# Patient Record
Sex: Female | Born: 1995 | Race: White | Hispanic: No | Marital: Single | Smoking: Never smoker
Health system: Southern US, Community
[De-identification: ages and names within clinical notes are randomized; demographics above are authoritative.]

## PROBLEM LIST (undated history)

## (undated) DIAGNOSIS — F909 Attention-deficit hyperactivity disorder, unspecified type: Secondary | ICD-10-CM

## (undated) DIAGNOSIS — J45909 Unspecified asthma, uncomplicated: Secondary | ICD-10-CM

## (undated) HISTORY — PX: NO PAST SURGERIES: SHX2092

---
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
## 2017-05-25 ENCOUNTER — Encounter: Payer: Self-pay | Admitting: Emergency Medicine

## 2017-05-25 ENCOUNTER — Emergency Department
Admission: EM | Admit: 2017-05-25 | Discharge: 2017-05-25 | Disposition: A | Discharge status: Home / Self Care | Payer: No Typology Code available for payment source | Attending: Emergency Medicine | Admitting: Emergency Medicine

## 2017-05-25 ENCOUNTER — Emergency Department: Payer: No Typology Code available for payment source

## 2017-05-25 DIAGNOSIS — Z3A00 Weeks of gestation of pregnancy not specified: Secondary | ICD-10-CM | POA: Insufficient documentation

## 2017-05-25 DIAGNOSIS — N939 Abnormal uterine and vaginal bleeding, unspecified: Secondary | ICD-10-CM

## 2017-05-25 DIAGNOSIS — N898 Other specified noninflammatory disorders of vagina: Secondary | ICD-10-CM | POA: Insufficient documentation

## 2017-05-25 DIAGNOSIS — O039 Complete or unspecified spontaneous abortion without complication: Secondary | ICD-10-CM | POA: Insufficient documentation

## 2017-05-25 HISTORY — DX: Attention-deficit hyperactivity disorder, unspecified type: F90.9

## 2017-05-25 LAB — CBC
HCT: 38.1 % (ref 35.0–47.0)
Hemoglobin: 12.5 g/dL (ref 12.0–16.0)
MCH: 28.1 pg (ref 26.0–34.0)
MCHC: 32.9 g/dL (ref 32.0–36.0)
MCV: 85.5 fL (ref 80.0–100.0)
PLATELETS: 373 10*3/uL (ref 150–440)
RBC: 4.46 MIL/uL (ref 3.80–5.20)
RDW: 14.9 % — ABNORMAL HIGH (ref 11.5–14.5)
WBC: 10.3 10*3/uL (ref 3.6–11.0)

## 2017-05-25 LAB — CHLAMYDIA/NGC RT PCR (ARMC ONLY)
CHLAMYDIA TR: NOT DETECTED
N gonorrhoeae: NOT DETECTED

## 2017-05-25 LAB — WET PREP, GENITAL
CLUE CELLS WET PREP: NONE SEEN
SPERM: NONE SEEN
TRICH WET PREP: NONE SEEN
Yeast Wet Prep HPF POC: NONE SEEN

## 2017-05-25 LAB — HCG, QUANTITATIVE, PREGNANCY: hCG, Beta Chain, Quant, S: <1 m[IU]/mL (ref <5)

## 2017-05-25 LAB — POCT PREGNANCY, URINE: Preg Test, Ur: NEGATIVE

## 2017-05-25 LAB — ABO/RH: ABO/RH(D): O POS

## 2017-05-25 MED ORDER — ONDANSETRON 4 MG PO TBDP: 4.0000 mg | ORAL_TABLET | Freq: Three times a day (TID) | ORAL | 0 refills | Status: AC | PRN

## 2017-05-25 NOTE — ED Provider Notes (Signed)
[facility] Emergency Department Provider Note  Time seen: 6:11 PM  I have reviewed the triage vital signs and the nursing notes.   HISTORY  Chief Complaint Vaginal Bleeding    HPI Melanie Brewer is a 21 y.o. female With no past medical history who presents to the emergency department with vaginal bleeding and a positive pregnancy test. According to the patient her last normal menstrual period was mid July, she states she had some spotting in August she saw her doctor who performed a blood pregnancy test in mid August resulted positive. She states the bleeding had resolved however over the past several days she has once again began spotting. Given the vaginal bleeding with positive brain CT she came to the emergent department for evaluation. Does note mild vaginal discharge but denies abdominal pain.   Past Medical History:  Diagnosis Date  . ADHD     There are no active problems to display for this patient.   History reviewed. No pertinent surgical history.  Prior to Admission medications   Not on File    Allergies  Allergen Reactions  . Sulfa Antibiotics     No family history on file.  Social History Social History  Substance Use Topics  . Smoking status: Never Smoker  . Smokeless tobacco: Never Used  . Alcohol use No    Review of Systems Constitutional: Negative for fever Cardiovascular: Negative for chest pain. Respiratory: Negative for shortness of breath. Gastrointestinal: Negative for abdominal pain, vomiting and diarrhea. Genitourinary: Negative for [email or web address] vaginal discharge. Positive for vaginal spotting. Neurological: Negative for headache All other ROS negative  ____________________________________________   PHYSICAL EXAM:  VITAL SIGNS: ED Triage Vitals  Enc Vitals Group     BP 05/25/17 1746 132/84     Pulse Rate 05/25/17 1746 77     Resp 05/25/17 1746 16     Temp 05/25/17 1746 98.4 F (36.9 C)     Temp Source  05/25/17 1746 Oral     SpO2 05/25/17 1746 100 %     Weight 05/25/17 1748 180 lb (81.6 kg)     Height 05/25/17 1748 5\' 6"  (1.676 m)     Head Circumference --      Peak Flow --      Pain Score 05/25/17 1744 5     Pain Loc --      Pain Edu? --      Excl. in GC? --     Constitutional: Alert and oriented. Well appearing and in no distress. Eyes: Normal exam ENT   Head: Normocephalic and atraumatic   Mouth/Throat: Mucous membranes are moist. Cardiovascular: Normal rate, regular rhythm. No murmur Respiratory: Normal respiratory effort without tachypnea nor retractions. Breath sounds are clear  Gastrointestinal: Soft and nontender. No distention Musculoskeletal: Nontender with normal range of motion in all extremities.  Neurologic:  Normal speech and language. No gross focal neurologic deficits Skin:  Skin is warm, dry and intact.  Psychiatric: Mood and affect are normal.   ____________________________________________    RADIOLOGY  ultrasound negative  ____________________________________________   INITIAL IMPRESSION / ASSESSMENT AND PLAN / ED COURSE  Pertinent labs & imaging results that were available during my care of the patient were reviewed by me and considered in my medical decision making (see chart for details).  patient presents to the emergency department for vaginal bleeding with a positive pregnancy test. Has not yet seen an OB/GYN. Given the mild vaginal discharge we will perform a pelvic exam and obtain  an ultrasound as well as labs. Overall patient appears extremely well, no distress, completely nontender abdomen.  ultrasound is normal. No pregnancy identified. pregnancy hormone level is negative. As the patient had a positive pregnancy test in August-suspect the patient has a completed miscarriage at this point. Patient will follow up with her doctor. We will prescribe a nausea medication for the  patient.  ____________________________________________   FINAL CLINICAL IMPRESSION(S) / ED DIAGNOSES  threatened miscarriage      Minna AntisPaduchowski, Deasha Clendenin, MD 05/25/17 2004

## 2017-05-25 NOTE — ED Triage Notes (Signed)
Pt to ED via POV c/o vaginal bleeding. Pt is approximately [redacted] weeks pregnant. Bleeding started this morning and has gotten heavier throughout the day. Pt having mild cramping in the lower abdomen. Pt in NAD at this time.

## 2018-04-14 IMAGING — US US TRANSVAGINAL NON-OB
1 series · 13 of 25 positions shown · non-contrast
Comparison: None

CLINICAL DATA: Vaginal bleeding since this morning

EXAM:
TRANSABDOMINAL AND TRANSVAGINAL ULTRASOUND OF PELVIS
TECHNIQUE: Both transabdominal and transvaginal ultrasound examinations of the
pelvis were performed. Transabdominal technique was performed for
global imaging of the pelvis including uterus, ovaries, adnexal
regions, and pelvic cul-de-sac. It was necessary to proceed with
endovaginal exam following the transabdominal exam to visualize the
uterus and bilateral ovaries..

[Series 1: us transvaginal non-ob · 0.26mm/px · 13 of 131 slices shown]
[im 1/131]
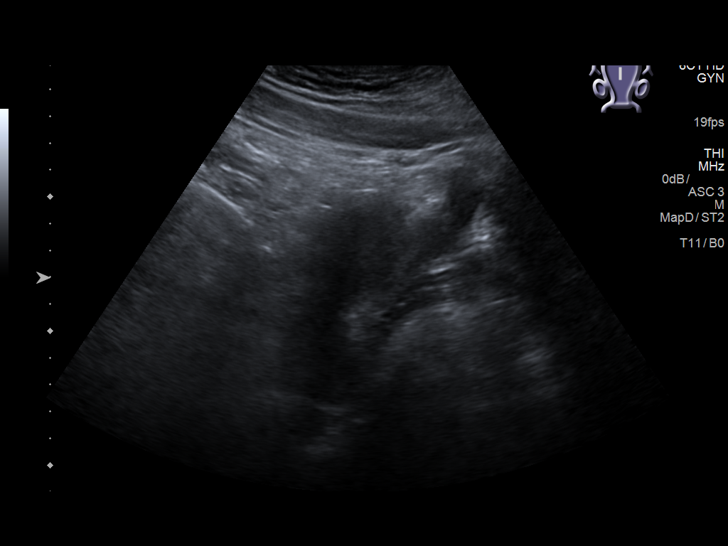
[im 11/131]
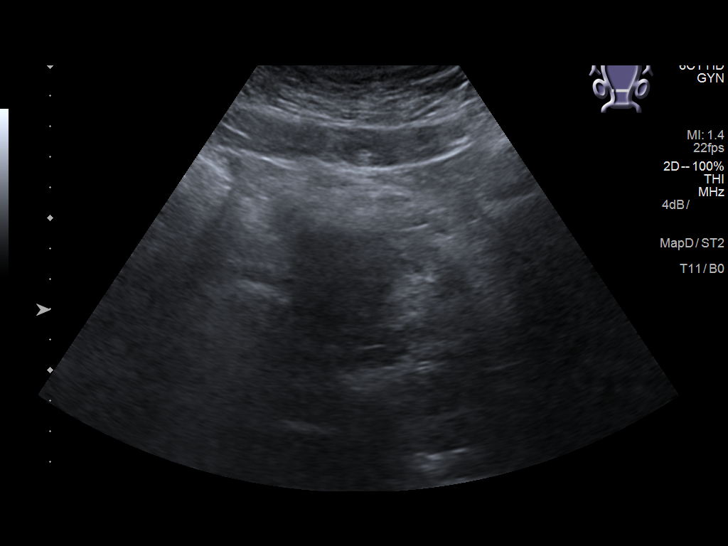
[im 22/131]
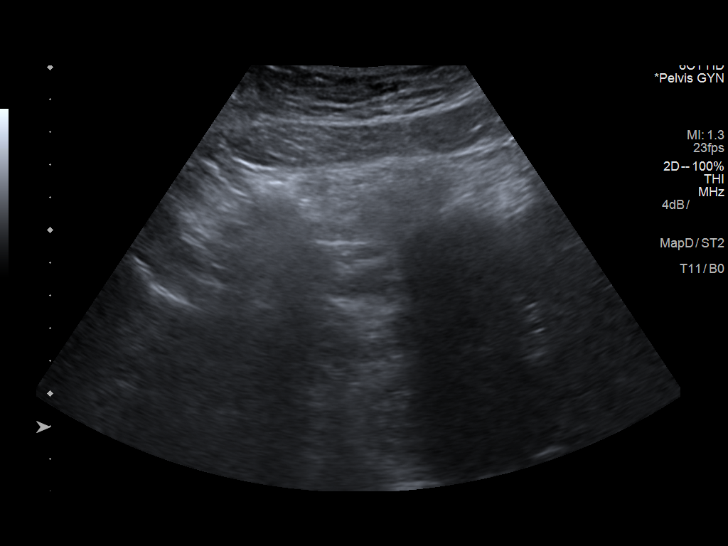
[im 33/131]
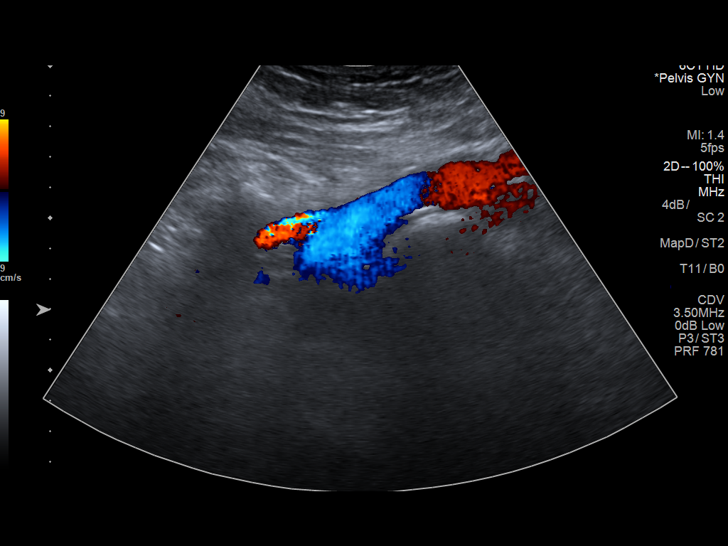
[im 44/131]
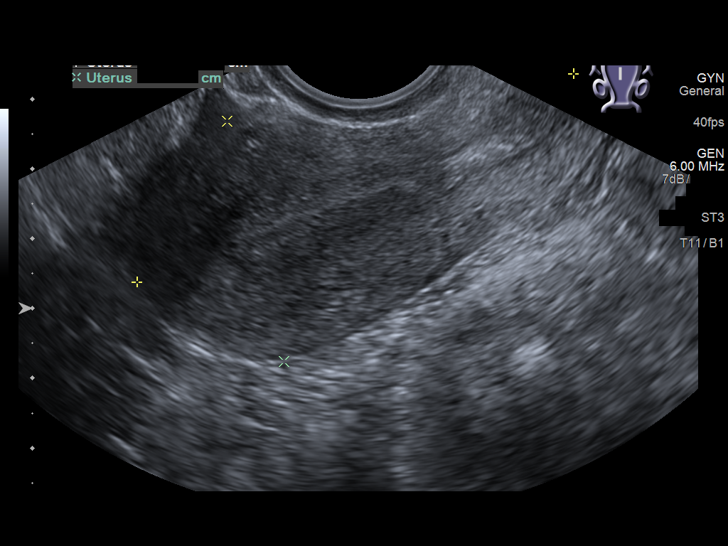
[im 55/131]
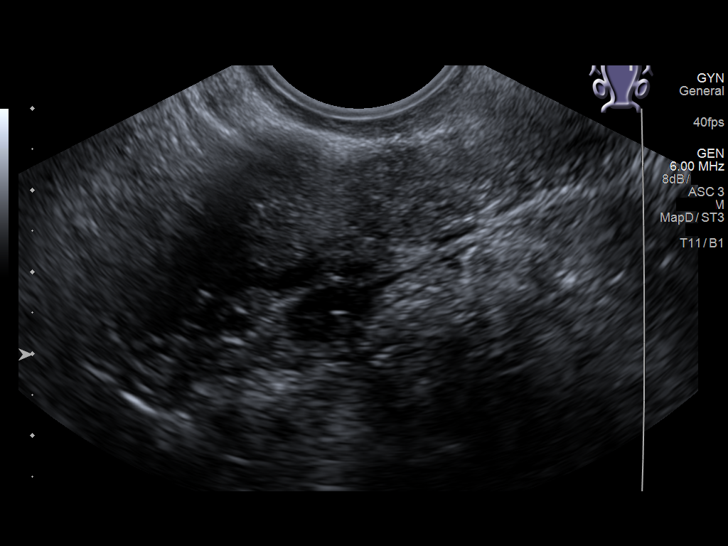
[im 66/131]
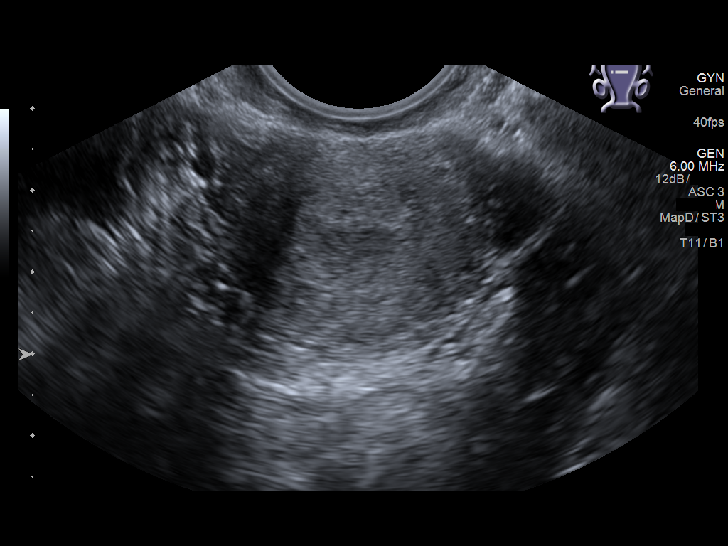
[im 76/131]
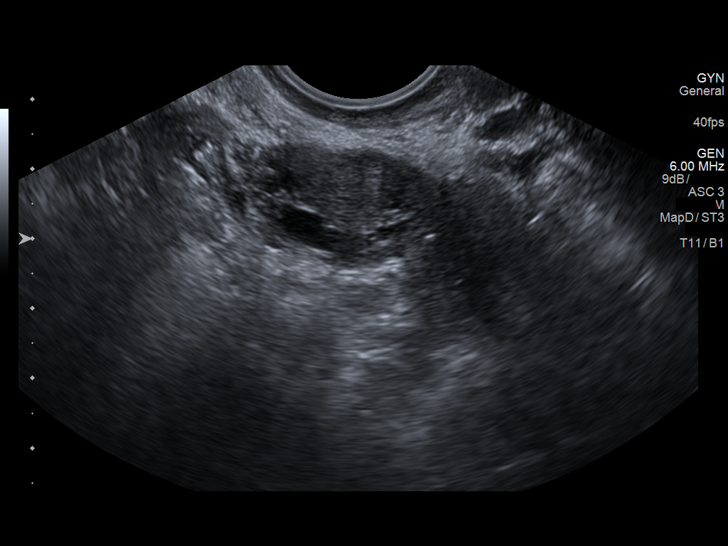
[im 87/131]
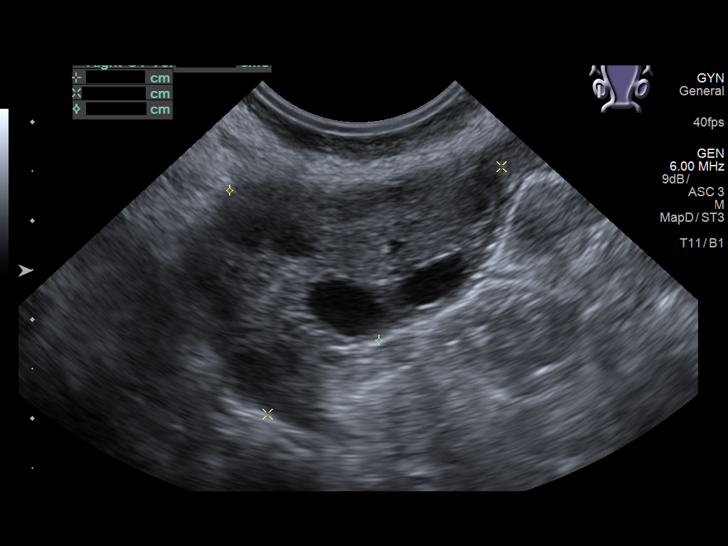
[im 98/131]
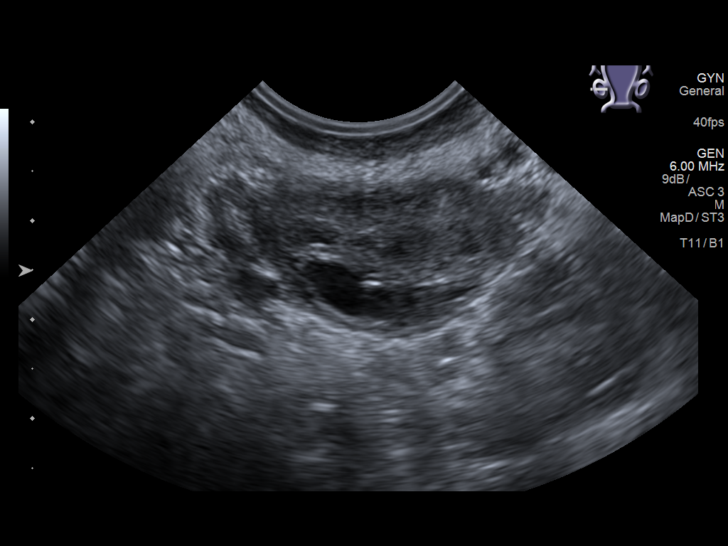
[im 109/131]
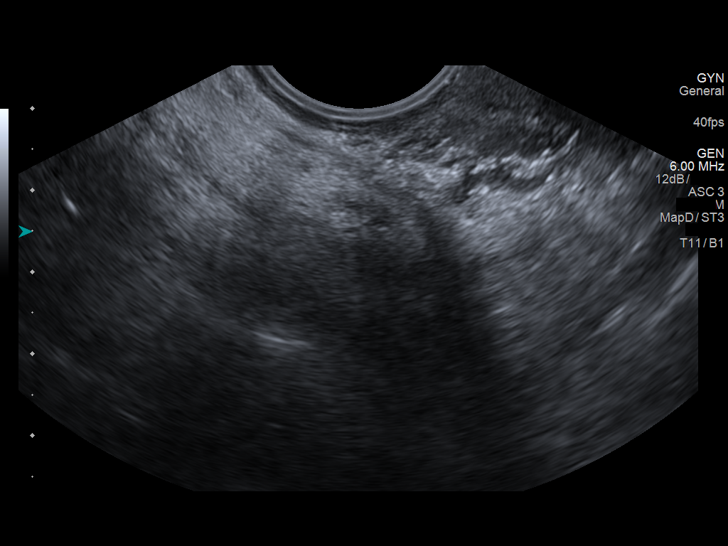
[im 120/131]
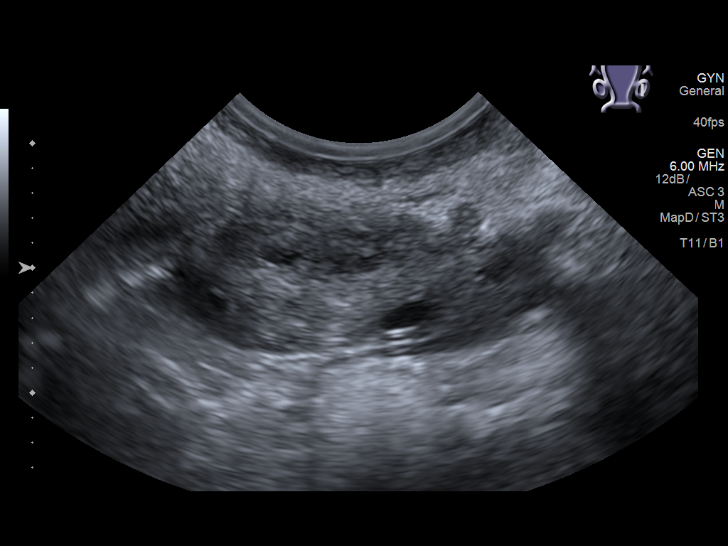
[im 131/131]
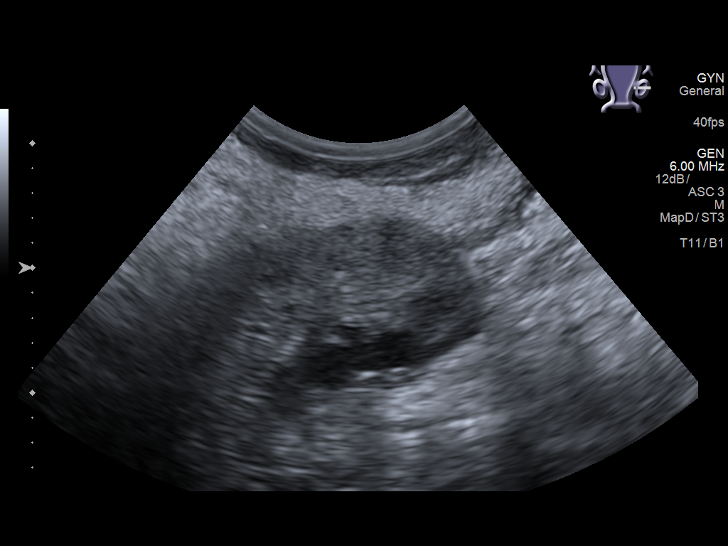

[13 of 25 positions shown; findings below may reference images not displayed]

FINDINGS: Uterus

Measurements: 6.9 x 3.5 x 4.4 cm. There is a 1.5 x 1.3 x 1.7 cm iso
to slightly hypoechoic area in the anterior fundal uterine
myometrium, question fibroid.

Endometrium

Thickness: 2.8 mm.  No focal abnormality visualized.

Right ovary

Measurements: 3.4 x 2.1 x 2.7 cm. Normal appearance/no adnexal mass.
Small right ovarian cysts are noted.

Left ovary

Measurements: 3.4 x 1.2 x 1.9 cm. Normal appearance/no adnexal mass.

Other findings

Small amount of free fluid is identified in the pelvis.
IMPRESSION: No acute abnormality identified.

There is a 1.5 x 1.3 x 1.7 cm iso to slightly hypoechoic area in the
anterior fundal uterine myometrium, question fibroid.

## 2019-12-01 ENCOUNTER — Ambulatory Visit: Payer: Self-pay | Attending: Internal Medicine

## 2019-12-01 DIAGNOSIS — Z23 Encounter for immunization: Secondary | ICD-10-CM

## 2019-12-01 NOTE — Progress Notes (Signed)
   Covid-19 Vaccination Clinic  Name:  Melanie Brewer    MRN: [id number] DOB: 1996/04/21  12/01/2019  [name] was observed post Covid-19 immunization for 15 minutes without incident. She was provided with Vaccine Information Sheet and instruction to access the V-Safe system.   [name] was instructed to call 911 with any severe reactions post vaccine: Marland Kitchen Difficulty breathing  . Swelling of face and throat  . A fast heartbeat  . A bad rash all over body  . Dizziness and weakness   Immunizations Administered    Name Date Dose VIS Date Route   Pfizer COVID-19 Vaccine 12/01/2019  4:37 PM 0.3 mL 09/03/2019 Intramuscular   Manufacturer: [employer], [employer]   Lot: [id number]   NDC: [address]-4

## 2019-12-22 ENCOUNTER — Ambulatory Visit: Payer: Self-pay | Attending: Internal Medicine

## 2019-12-22 DIAGNOSIS — Z23 Encounter for immunization: Secondary | ICD-10-CM

## 2019-12-22 NOTE — Progress Notes (Signed)
   Covid-19 Vaccination Clinic  Name:  Melanie Brewer    MRN: [id number] DOB: 09-08-1996  12/22/2019  [name] was observed post Covid-19 immunization for 30 minutes based on pre-vaccination screening without incident. She was provided with Vaccine Information Sheet and instruction to access the V-Safe system.   [name] was instructed to call 911 with any severe reactions post vaccine: Marland Kitchen Difficulty breathing  . Swelling of face and throat  . A fast heartbeat  . A bad rash all over body  . Dizziness and weakness   Immunizations Administered    Name Date Dose VIS Date Route   Pfizer COVID-19 Vaccine 12/22/2019  3:43 PM 0.3 mL 09/03/2019 Intramuscular   Manufacturer: [employer], [employer]   Lot: [id number]   NDC: [address]-9

## 2020-07-31 ENCOUNTER — Ambulatory Visit
Admission: EM | Admit: 2020-07-31 | Discharge: 2020-07-31 | Disposition: A | Discharge status: Home / Self Care | Payer: Managed Care, Other (non HMO) | Attending: Family Medicine | Admitting: Family Medicine

## 2020-07-31 DIAGNOSIS — Z1152 Encounter for screening for COVID-19: Secondary | ICD-10-CM | POA: Diagnosis not present

## 2020-07-31 NOTE — ED Triage Notes (Signed)
Patient here for COVID testing. No symptoms. Needs for work.

## 2020-07-31 NOTE — Discharge Instructions (Signed)
Please check MyChart for your results! Thanks!

## 2020-08-01 LAB — SARS-COV-2, NAA 2 DAY TAT

## 2020-08-01 LAB — NOVEL CORONAVIRUS, NAA: SARS-CoV-2, NAA: NOT DETECTED

## 2021-07-07 ENCOUNTER — Emergency Department: Payer: BC Managed Care – PPO

## 2021-07-07 ENCOUNTER — Emergency Department
Admission: EM | Admit: 2021-07-07 | Discharge: 2021-07-07 | Disposition: A | Discharge status: Home / Self Care | Payer: BC Managed Care – PPO | Attending: Emergency Medicine | Admitting: Emergency Medicine

## 2021-07-07 DIAGNOSIS — R55 Syncope and collapse: Secondary | ICD-10-CM | POA: Insufficient documentation

## 2021-07-07 DIAGNOSIS — M79604 Pain in right leg: Secondary | ICD-10-CM | POA: Diagnosis not present

## 2021-07-07 DIAGNOSIS — J45909 Unspecified asthma, uncomplicated: Secondary | ICD-10-CM | POA: Insufficient documentation

## 2021-07-07 DIAGNOSIS — S3991XA Unspecified injury of abdomen, initial encounter: Secondary | ICD-10-CM | POA: Diagnosis present

## 2021-07-07 DIAGNOSIS — Y92410 Unspecified street and highway as the place of occurrence of the external cause: Secondary | ICD-10-CM | POA: Diagnosis not present

## 2021-07-07 DIAGNOSIS — R064 Hyperventilation: Secondary | ICD-10-CM | POA: Insufficient documentation

## 2021-07-07 HISTORY — DX: Unspecified asthma, uncomplicated: J45.909

## 2021-07-07 LAB — CBC WITH DIFFERENTIAL/PLATELET
Abs Immature Granulocytes: 0.02 10*3/uL (ref 0.00–0.07)
Basophils Absolute: 0 10*3/uL (ref 0.0–0.1)
Basophils Relative: 0 %
Eosinophils Absolute: 0.1 10*3/uL (ref 0.0–0.5)
Eosinophils Relative: 1 %
HCT: 34.3 % — ABNORMAL LOW (ref 36.0–46.0)
Hemoglobin: 11.4 g/dL — ABNORMAL LOW (ref 12.0–15.0)
Immature Granulocytes: 0 %
Lymphocytes Relative: 24 %
Lymphs Abs: 2.4 10*3/uL (ref 0.7–4.0)
MCH: 28.4 pg (ref 26.0–34.0)
MCHC: 33.2 g/dL (ref 30.0–36.0)
MCV: 85.5 fL (ref 80.0–100.0)
Monocytes Absolute: 0.5 10*3/uL (ref 0.1–1.0)
Monocytes Relative: 5 %
Neutro Abs: 7 10*3/uL (ref 1.7–7.7)
Neutrophils Relative %: 70 %
Platelets: 395 10*3/uL (ref 150–400)
RBC: 4.01 MIL/uL (ref 3.87–5.11)
RDW: 14.4 % (ref 11.5–15.5)
WBC: 10 10*3/uL (ref 4.0–10.5)
nRBC: 0 % (ref 0.0–0.2)

## 2021-07-07 LAB — COMPREHENSIVE METABOLIC PANEL
ALT: 12 U/L (ref 0–44)
AST: 19 U/L (ref 15–41)
Albumin: 4.6 g/dL (ref 3.5–5.0)
Alkaline Phosphatase: 78 U/L (ref 38–126)
Anion gap: 6 (ref 5–15)
BUN: 11 mg/dL (ref 6–20)
CO2: 27 mmol/L (ref 22–32)
Calcium: 9.2 mg/dL (ref 8.9–10.3)
Chloride: 105 mmol/L (ref 98–111)
Creatinine, Ser: 0.76 mg/dL (ref 0.44–1.00)
GFR, Estimated: >60 mL/min (ref >60)
Glucose, Bld: 101 mg/dL — ABNORMAL HIGH (ref 70–99)
Potassium: 3.8 mmol/L (ref 3.5–5.1)
Sodium: 138 mmol/L (ref 135–145)
Total Bilirubin: 0.6 mg/dL (ref 0.3–1.2)
Total Protein: 8 g/dL (ref 6.5–8.1)

## 2021-07-07 LAB — LIPASE, BLOOD: Lipase: 25 U/L (ref 11–51)

## 2021-07-07 LAB — HCG, QUANTITATIVE, PREGNANCY: hCG, Beta Chain, Quant, S: <1 m[IU]/mL (ref <5)

## 2021-07-07 MED ORDER — KETOROLAC TROMETHAMINE 30 MG/ML IJ SOLN
15.0000 mg | Freq: Once | INTRAMUSCULAR | Status: AC
  Administered 2021-07-07: 15 mg via INTRAVENOUS
  Filled 2021-07-07: qty 1

## 2021-07-07 MED ORDER — IOHEXOL 300 MG/ML  SOLN
100.0000 mL | Freq: Once | INTRAMUSCULAR | Status: AC | PRN
  Administered 2021-07-07: 100 mL via INTRAVENOUS

## 2021-07-07 MED ORDER — CYCLOBENZAPRINE HCL 10 MG PO TABS: 10.0000 mg | ORAL_TABLET | Freq: Three times a day (TID) | ORAL | 0 refills | Status: AC | PRN

## 2021-07-07 MED ORDER — IBUPROFEN 600 MG PO TABS: 600.0000 mg | ORAL_TABLET | Freq: Three times a day (TID) | ORAL | 0 refills | Status: AC | PRN
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
## 2021-07-07 NOTE — ED Notes (Signed)
Pt transported back from CT via stretcher with CT tech. Dr. Erma Heritage notified pt is in room.

## 2021-07-07 NOTE — ED Notes (Signed)
Pt transported back to room from xray via stretcher with xray tech.

## 2021-07-07 NOTE — ED Notes (Signed)
Patient transported to CT 
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
## 2021-07-24 ENCOUNTER — Ambulatory Visit (INDEPENDENT_AMBULATORY_CARE_PROVIDER_SITE_OTHER): Payer: BC Managed Care – PPO

## 2021-07-24 ENCOUNTER — Other Ambulatory Visit: Payer: Self-pay

## 2021-07-24 ENCOUNTER — Ambulatory Visit: Payer: BC Managed Care – PPO

## 2021-07-24 ENCOUNTER — Ambulatory Visit
Admission: EM | Admit: 2021-07-24 | Discharge: 2021-07-24 | Disposition: A | Discharge status: Home / Self Care | Payer: BC Managed Care – PPO

## 2021-07-24 DIAGNOSIS — J101 Influenza due to other identified influenza virus with other respiratory manifestations: Secondary | ICD-10-CM | POA: Diagnosis not present

## 2021-07-24 DIAGNOSIS — R042 Hemoptysis: Secondary | ICD-10-CM

## 2021-07-24 DIAGNOSIS — R059 Cough, unspecified: Secondary | ICD-10-CM | POA: Diagnosis not present

## 2021-07-24 DIAGNOSIS — R058 Other specified cough: Secondary | ICD-10-CM

## 2021-07-24 LAB — POCT INFLUENZA A/B
Influenza A, POC: POSITIVE — AB
Influenza B, POC: NEGATIVE

## 2021-07-24 MED ORDER — OSELTAMIVIR PHOSPHATE 75 MG PO CAPS: 75.0000 mg | ORAL_CAPSULE | Freq: Two times a day (BID) | ORAL | 0 refills | Status: AC

## 2021-07-24 NOTE — Discharge Instructions (Addendum)
Your chest x-ray is normal.    Take the Tamiflu as directed.  Take Tylenol or ibuprofen as needed for fever or discomfort.  Follow up with your primary care provider if your symptoms are not improving.

## 2021-07-24 NOTE — ED Triage Notes (Signed)
Pt presents with deep cough x 2 days then fever (101.7), HA, and coughing up specks of blood today.  Took Tylenol at 0730 this morning.

## 2021-07-24 NOTE — ED Provider Notes (Signed)
Renaldo Fiddler    CSN: [id number] Arrival date & time: 07/24/21  1409      History   Chief Complaint Chief Complaint  Patient presents with   Cough   Fever    HPI Melanie Brewer is a 25 y.o. female.  Patient presents with fever, headache, cough x2 days.  She reports coughing up specks of blood with yellow sputum this morning.  Treatment at home with Tylenol; last dose at 07 30.  She denies rash, sore throat, shortness of breath, vomiting, diarrhea, or other symptoms.  Her medical history includes asthma and ADHD.  She does not smoke but does occasionally vape.  The history is provided by the patient and medical records.   Past Medical History:  Diagnosis Date   ADHD    ADHD    Asthma     There are no problems to display for this patient.   History reviewed. No pertinent surgical history.  OB History     Gravida  1   Para      Term      Preterm      AB      Living         SAB      IAB      Ectopic      Multiple      Live Births               Home Medications    Prior to Admission medications   Medication Sig Start Date End Date Taking? Authorizing Provider  amphetamine-dextroamphetamine (ADDERALL) 30 MG tablet Take 30 mg by mouth daily.   Yes [provider]  cyclobenzaprine (FLEXERIL) 10 MG tablet Take 1 tablet (10 mg total) by mouth 3 (three) times daily as needed for muscle spasms. 07/07/21  Yes Shaune Pollack, MD  ibuprofen (ADVIL) 600 MG tablet Take 1 tablet (600 mg total) by mouth every 8 (eight) hours as needed. 07/07/21  Yes Shaune Pollack, MD  oseltamivir (TAMIFLU) 75 MG capsule Take 1 capsule (75 mg total) by mouth every 12 (twelve) hours. 07/24/21  Yes Mickie Bail, NP  ondansetron (ZOFRAN ODT) 4 MG disintegrating tablet Take 1 tablet (4 mg total) by mouth every 8 (eight) hours as needed for nausea or vomiting. 05/25/17   Minna Antis, MD    Family History Family History  Problem Relation Age of Onset    Healthy Mother    Cancer Father     Social History Social History   Tobacco Use   Smoking status: Never   Smokeless tobacco: Never  Vaping Use   Vaping Use: Some days   Substances: Flavoring  Substance Use Topics   Alcohol use: No   Drug use: No     Allergies   Sulfa antibiotics   Review of Systems Review of Systems  Constitutional:  Positive for fever. Negative for chills.  HENT:  Negative for ear pain and sore throat.   Respiratory:  Positive for cough. Negative for shortness of breath.        Hemoptysis  Cardiovascular:  Negative for chest pain and palpitations.  Gastrointestinal:  Negative for diarrhea and vomiting.  Skin:  Negative for color change and rash.  All other systems reviewed and are negative.   Physical Exam Triage Vital Signs ED Triage Vitals [07/24/21 1447]  Enc Vitals Group     BP      Pulse      Resp      Temp  Temp src      SpO2      Weight      Height      Head Circumference      Peak Flow      Pain Score 2     Pain Loc      Pain Edu?      Excl. in GC?    No data found.  Updated Vital Signs BP 113/76 (BP Location: Left Arm)   Pulse 92   Temp 100 F (37.8 C) (Oral)   Resp 20   LMP 06/29/2021 (Exact Date)   SpO2 98%   Breastfeeding No   Visual Acuity Right Eye Distance:   Left Eye Distance:   Bilateral Distance:    Right Eye Near:   Left Eye Near:    Bilateral Near:     Physical Exam Vitals and nursing note reviewed.  Constitutional:      General: She is not in acute distress.    Appearance: She is well-developed.  HENT:     Head: Normocephalic and atraumatic.     Right Ear: Tympanic membrane normal.     Left Ear: Tympanic membrane normal.     Nose: Nose normal.     Mouth/Throat:     Mouth: Mucous membranes are moist.     Pharynx: Oropharynx is clear.  Eyes:     Conjunctiva/sclera: Conjunctivae normal.  Cardiovascular:     Rate and Rhythm: Normal rate and regular rhythm.     Heart sounds: Normal heart  sounds.  Pulmonary:     Effort: Pulmonary effort is normal. No respiratory distress.     Breath sounds: Normal breath sounds. No wheezing or rhonchi.  Abdominal:     Palpations: Abdomen is soft.     Tenderness: There is no abdominal tenderness.  Musculoskeletal:     Cervical back: Neck supple.  Skin:    General: Skin is warm and dry.  Neurological:     Mental Status: She is alert.  Psychiatric:        Mood and Affect: Mood normal.        Behavior: Behavior normal.     UC Treatments / Results  Labs (all labs ordered are listed, but only abnormal results are displayed) Labs Reviewed  POCT INFLUENZA A/B - Abnormal; Notable for the following components:      Result Value   Influenza A, POC Positive (*)    All other components within normal limits    EKG   Radiology DG Chest 2 View  Result Date: 07/24/2021 CLINICAL DATA:  Productive cough, hemoptysis EXAM: CHEST - 2 VIEW COMPARISON:  07/07/2021 FINDINGS: The heart size and mediastinal contours are within normal limits. Both lungs are clear. The visualized skeletal structures are unremarkable. IMPRESSION: Normal study. Electronically Signed   By: Charlett Nose M.D.   On: 07/24/2021 15:49    Procedures Procedures (including critical care time)  Medications Ordered in UC Medications - No data to display  Initial Impression / Assessment and Plan / UC Course  I have reviewed the triage vital signs and the nursing notes.  Pertinent labs & imaging results that were available during my care of the patient were reviewed by me and considered in my medical decision making (see chart for details).   Influenza A, hemoptysis, productive cough.  Chest x-ray normal.  Treating with Tamiflu.  Instructed patient to take Tylenol or ibuprofen as needed for fever or discomfort and to follow-up with her PCP if her symptoms  are not improving.  Education provided on influenza.  Patient agrees to plan of care.   Final Clinical Impressions(s) / UC  Diagnoses   Final diagnoses:  Influenza A  Hemoptysis  Productive cough     Discharge Instructions      Your chest x-ray is normal.    Take the Tamiflu as directed.  Take Tylenol or ibuprofen as needed for fever or discomfort.  Follow up with your primary care provider if your symptoms are not improving.         ED Prescriptions     Medication Sig Dispense Auth. Provider   oseltamivir (TAMIFLU) 75 MG capsule Take 1 capsule (75 mg total) by mouth every 12 (twelve) hours. 10 capsule Mickie Bail, NP      PDMP not reviewed this encounter.   Mickie Bail, NP 07/24/21 [phone]

## 2022-04-24 ENCOUNTER — Ambulatory Visit (LOCAL_COMMUNITY_HEALTH_CENTER): Payer: Self-pay

## 2022-04-24 VITALS — BP 121/68 | Ht 66.75 in | Wt 210.5 lb

## 2022-04-24 DIAGNOSIS — Z3201 Encounter for pregnancy test, result positive: Secondary | ICD-10-CM

## 2022-04-24 LAB — PREGNANCY, URINE: Preg Test, Ur: POSITIVE — AB

## 2022-04-24 MED ORDER — PRENATAL 27-0.8 MG PO TABS: 1.0000 | ORAL_TABLET | Freq: Every day | ORAL | 0 refills | Status: AC

## 2022-04-24 NOTE — Progress Notes (Signed)
UPT positive. Unsure where she plans prenatal care. Local prenatal provider resource list given. Recently stopped Adderall when found out preg. Takes this med for ADHD. Advised to contact prescribing provider ([facility]- Dr Greggory Stallion) for alternatives while pregnant.   The patient was dispensed prenatal vitamins today. I provided counseling today regarding the medication. We discussed the medication, the side effects and when to call clinic. Patient given the opportunity to ask questions. Questions answered.    Sent to [employer] for medicaid/preg women. Jerel Shepherd, RN

## 2022-10-10 IMAGING — DX DG CHEST 2V
2 series · 2 of 2 positions shown · non-contrast
Comparison: 07/07/2021

CLINICAL DATA: Productive cough, hemoptysis

EXAM:
CHEST - 2 VIEW

[chest pa]
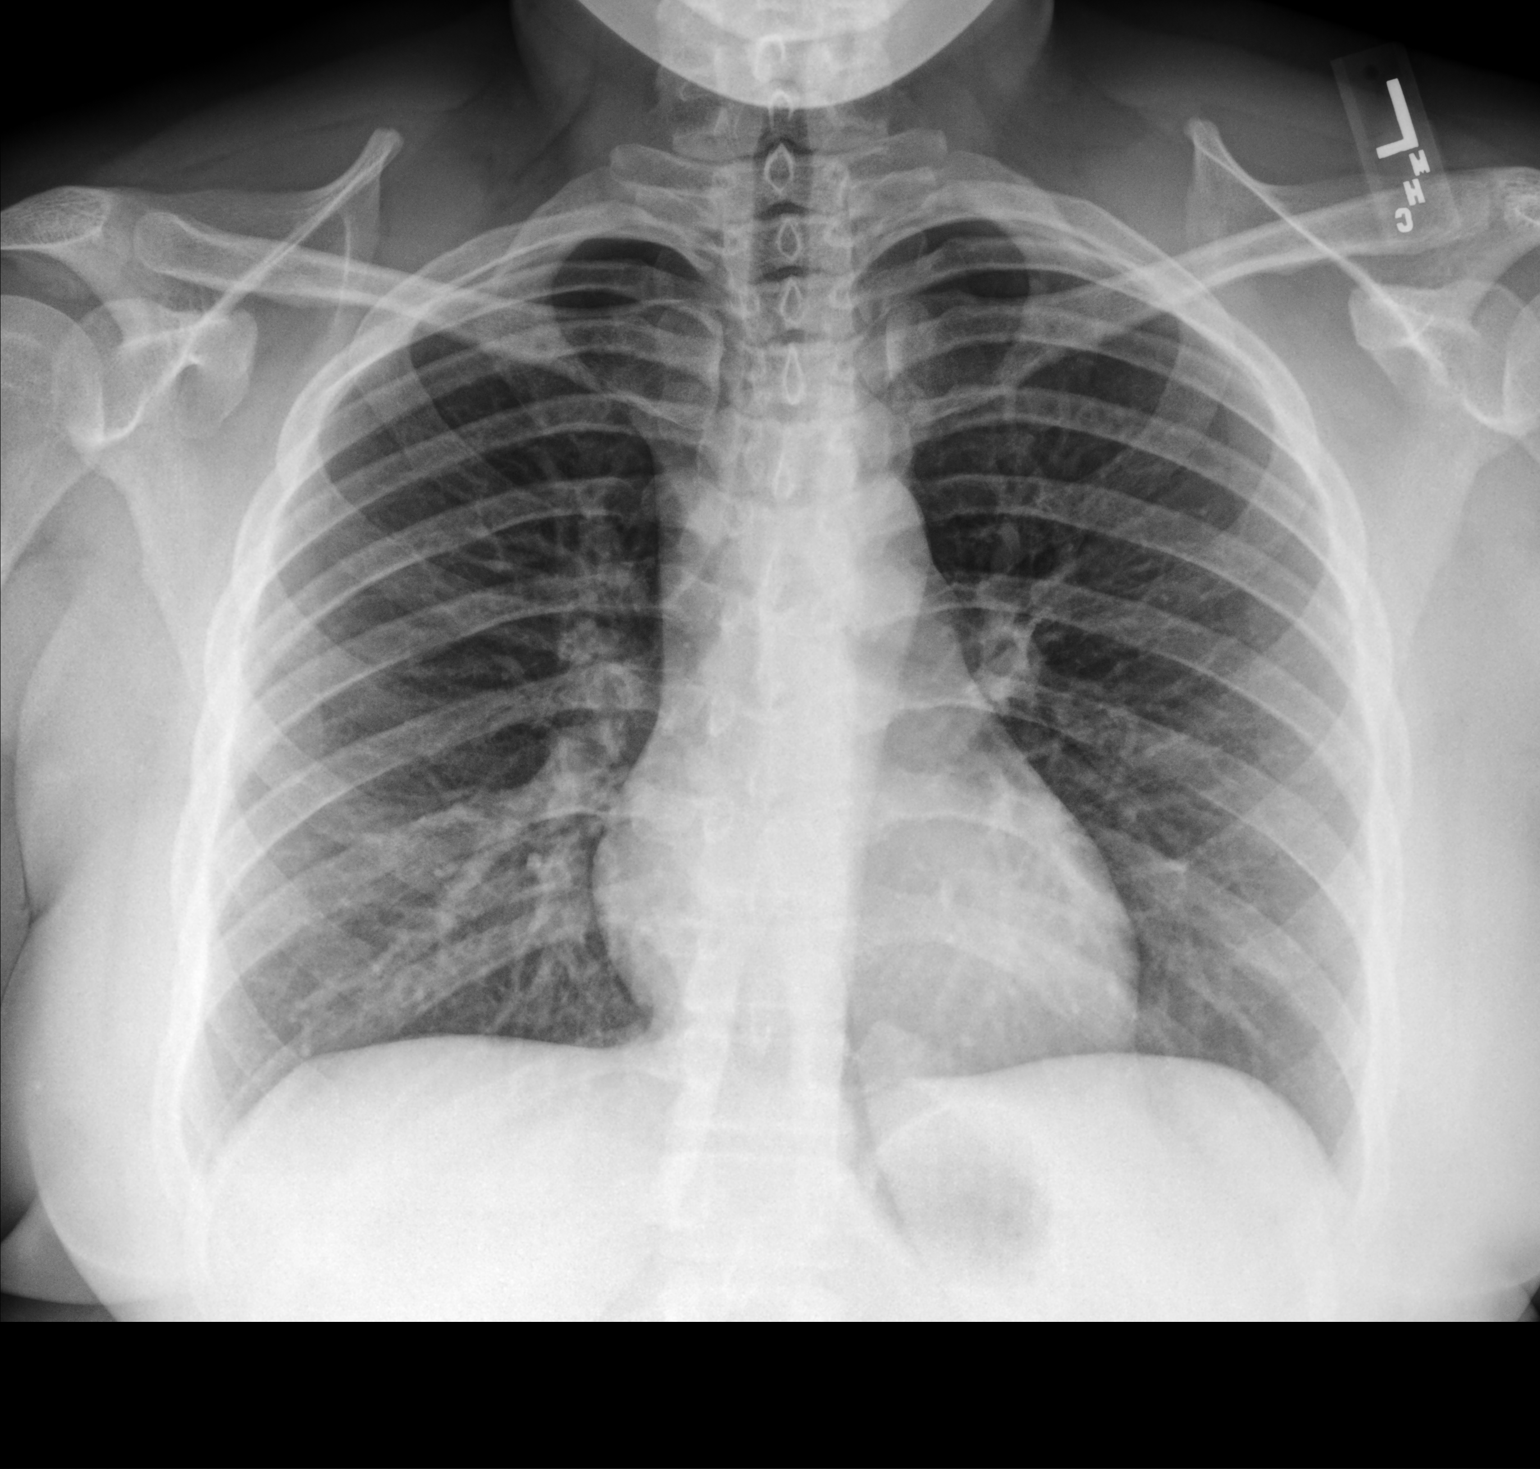

[chest lat]
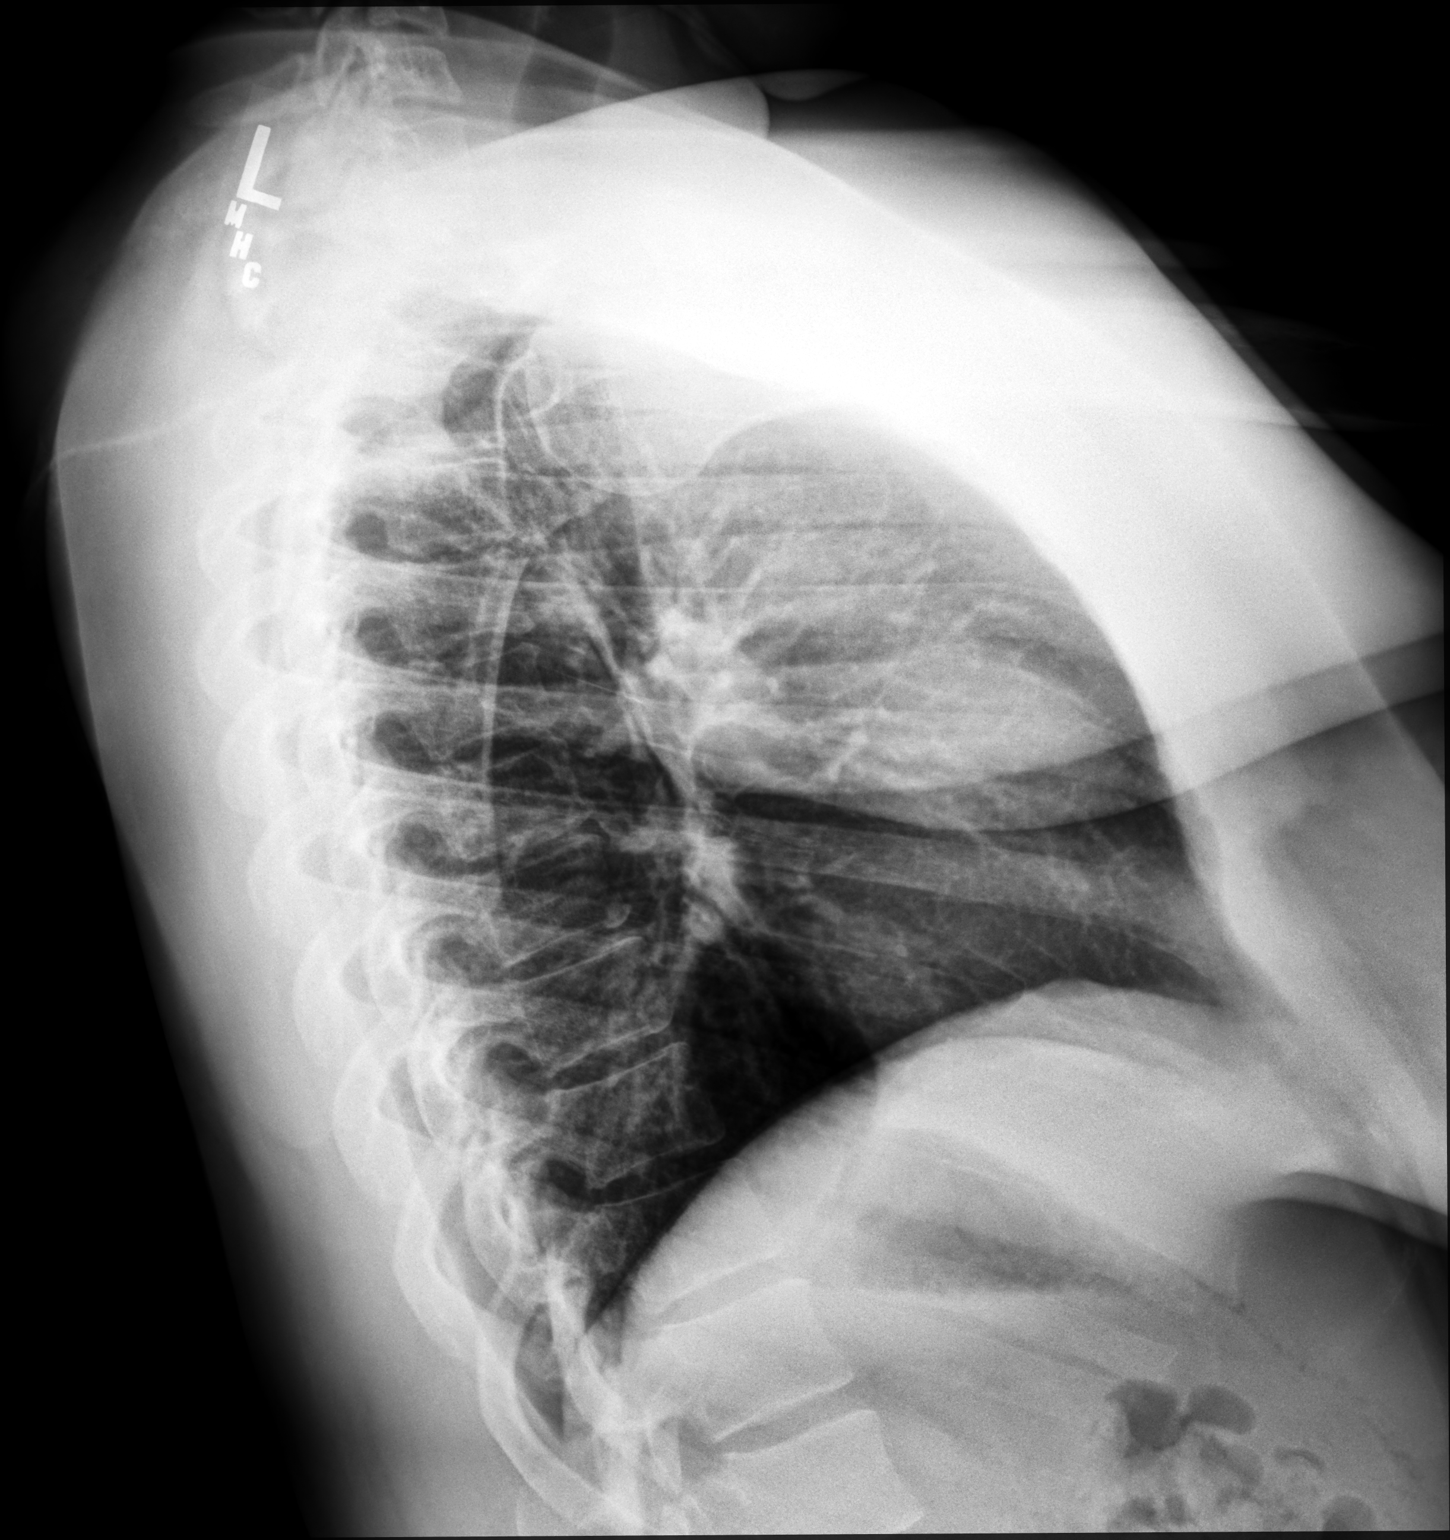

[2 of 2 positions shown; findings below may reference images not displayed]

FINDINGS: The heart size and mediastinal contours are within normal limits.
Both lungs are clear. The visualized skeletal structures are
unremarkable.
IMPRESSION: Normal study.
